# Patient Record
Sex: Female | Born: 2000 | Race: Black or African American | Hispanic: No | Marital: Single | Smoking: Never smoker
Health system: Southern US, Community
[De-identification: ages and names within clinical notes are randomized; demographics above are authoritative.]

## PROBLEM LIST (undated history)

## (undated) HISTORY — PX: OTHER SURGICAL HISTORY: SHX169

---
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
## 2022-11-04 ENCOUNTER — Encounter: Payer: Self-pay | Admitting: Emergency Medicine

## 2022-11-04 ENCOUNTER — Ambulatory Visit
Admission: EM | Admit: 2022-11-04 | Discharge: 2022-11-04 | Disposition: A | Discharge status: Home / Self Care | Payer: Self-pay | Attending: Family Medicine | Admitting: Family Medicine

## 2022-11-04 DIAGNOSIS — N3001 Acute cystitis with hematuria: Secondary | ICD-10-CM | POA: Insufficient documentation

## 2022-11-04 LAB — URINALYSIS, ROUTINE W REFLEX MICROSCOPIC
Glucose, UA: NEGATIVE mg/dL
Leukocytes,Ua: NEGATIVE
Nitrite: POSITIVE — AB
Protein, ur: NEGATIVE mg/dL
Specific Gravity, Urine: >1.03 — ABNORMAL HIGH (ref 1.005–1.030)
pH: 5.5 (ref 5.0–8.0)

## 2022-11-04 LAB — URINALYSIS, MICROSCOPIC (REFLEX)

## 2022-11-04 MED ORDER — FLUCONAZOLE 150 MG PO TABS: 150.0000 mg | ORAL_TABLET | ORAL | 0 refills | Status: AC

## 2022-11-04 MED ORDER — CEPHALEXIN 500 MG PO CAPS: 500.0000 mg | ORAL_CAPSULE | Freq: Four times a day (QID) | ORAL | 0 refills | Status: AC

## 2022-11-04 NOTE — ED Provider Notes (Signed)
[facility]    CSN: [id number] Arrival date & time: 11/04/22  1146      History   Chief Complaint Chief Complaint  Patient presents with   Dysuria     HPI HPI Stephanie Bradshaw is a 21 y.o. female.    Stephanie Bradshaw presents for dysuria that started 2 days ago.  Notes that her urine has a "sour" odor.  Denies known STI exposure.  She is not concerned for STIs.  She uses a Nexplanon for contraception.  Denies nausea, vomiting, vaginal itching, vaginal bleeding, rash, ulcers and fever.  Has normal vaginal discharge.  She is not currently pregnant.  No treatments prior to arrival.    History reviewed. No pertinent past medical history.  There are no problems to display for this patient.   Past Surgical History:  Procedure Laterality Date   lymph node surgery      OB History   No obstetric history on file.      Home Medications    Prior to Admission medications   Medication Sig Start Date End Date Taking? Authorizing Provider  cephALEXin (KEFLEX) 500 MG capsule Take 1 capsule (500 mg total) by mouth 4 (four) times daily. 11/04/22  Yes Robey Massmann, DO  fluconazole (DIFLUCAN) 150 MG tablet Take 1 tablet (150 mg total) by mouth every 3 (three) days for 2 doses. 11/04/22 11/08/22 Yes Katha Cabal, DO    Family History Family History  Problem Relation Age of Onset   Healthy Mother    Healthy Father     Social History Social History   Tobacco Use   Smoking status: Never   Smokeless tobacco: Never  Vaping Use   Vaping Use: Never used  Substance Use Topics   Alcohol use: No   Drug use: No     Allergies   Patient has no known allergies.   Review of Systems Review of Systems: :negative unless otherwise stated in HPI.      Physical Exam Triage Vital Signs ED Triage Vitals  Enc Vitals Group     BP 11/04/22 1337 113/82     Pulse Rate 11/04/22 1337 81     Resp 11/04/22 1337 16     Temp 11/04/22 1337 98.3 F (36.8 C)     Temp  Source 11/04/22 1337 Oral     SpO2 11/04/22 1337 100 %     Weight 11/04/22 1335 126 lb 12.2 oz (57.5 kg)     Height 11/04/22 1335 5' (1.524 m)     Head Circumference --      Peak Flow --      Pain Score 11/04/22 1335 0     Pain Loc --      Pain Edu? --      Excl. in GC? --    No data found.  Updated Vital Signs BP 113/82 (BP Location: Right Arm)   Pulse 81   Temp 98.3 F (36.8 C) (Oral)   Resp 16   Ht 5' (1.524 m)   Wt 57.5 kg   SpO2 100%   BMI 24.76 kg/m   Visual Acuity Right Eye Distance:   Left Eye Distance:   Bilateral Distance:    Right Eye Near:   Left Eye Near:    Bilateral Near:     Physical Exam GEN: well appearing female in no acute distress  CVS: well perfused  RESP: speaking in full sentences without pause  ABD: soft, non-tender, non-distended, no palpable masses, no CVA tenderness  SKIN: warm, dry    UC Treatments / Results  Labs (all labs ordered are listed, but only abnormal results are displayed) Labs Reviewed  URINALYSIS, ROUTINE W REFLEX MICROSCOPIC - Abnormal; Notable for the following components:      Result Value   APPearance HAZY (*)    Specific Gravity, Urine >1.030 (*)    Hgb urine dipstick TRACE (*)    Bilirubin Urine SMALL (*)    Ketones, ur TRACE (*)    Nitrite POSITIVE (*)    All other components within normal limits  URINALYSIS, MICROSCOPIC (REFLEX) - Abnormal; Notable for the following components:   Bacteria, UA MANY (*)    All other components within normal limits  URINE CULTURE    EKG   Radiology No results found.  Procedures Procedures (including critical care time)  Medications Ordered in UC Medications - No data to display  Initial Impression / Assessment and Plan / UC Course  I have reviewed the triage vital signs and the nursing notes.  Pertinent labs & imaging results that were available during my care of the patient were reviewed by me and considered in my medical decision making (see chart for  details).      Patient is a 21 y.o.Marland Kitchen female  who presents for 2 days of dysuria with odor.  Overall patient is well-appearing and afebrile.  Vital signs stable.  UA consistent with acute cystitis.   Hematuria supported on microscopy.  Treat with Keflex 4 times daily for 5 days.  Budding yeast were seen on microscopy. Urine culture obtained.  Follow-up sensitivities and change antibiotics, if needed.  - Treatment: Keflex as above - Diflucan for 2 doses for antibiotic associated yeast infection   Return precautions including abdominal pain, fever, chills, nausea, or vomiting given. Discussed MDM, treatment plan and plan for follow-up with patient who agrees with plan.         Final Clinical Impressions(s) / UC Diagnoses   Final diagnoses:  Acute cystitis with hematuria     Discharge Instructions      You had evidence of of a bacterial infection in your urine today.  Stop by the pharmacy to pick up your prescriptions.  For your UTI: Take Keflex 4 times a day for the next 5 days.  To prevent a yeast infection: Take 1 dose of Diflucan (fluconazole) on the 3rd day of treatment and the last dose the day after you complete your antibiotics.   If your symptoms do not improve in the next 7 days, be sure to follow-up here or at your primary care provider office.  Go to the emergency department if you are having increasing pain, worsening vaginal bleeding or fever.      ED Prescriptions     Medication Sig Dispense Auth. Provider   cephALEXin (KEFLEX) 500 MG capsule Take 1 capsule (500 mg total) by mouth 4 (four) times daily. 20 capsule Falicity Sheets, DO   fluconazole (DIFLUCAN) 150 MG tablet Take 1 tablet (150 mg total) by mouth every 3 (three) days for 2 doses. 2 tablet Katha Cabal, DO      PDMP not reviewed this encounter.   Katha Cabal, DO 11/04/22 1423

## 2022-11-04 NOTE — Discharge Instructions (Addendum)
You had evidence of of a bacterial infection in your urine today.  Stop by the pharmacy to pick up your prescriptions.  For your UTI: Take Keflex 4 times a day for the next 5 days.  To prevent a yeast infection: Take 1 dose of Diflucan (fluconazole) on the 3rd day of treatment and the last dose the day after you complete your antibiotics.   If your symptoms do not improve in the next 7 days, be sure to follow-up here or at your primary care provider office.  Go to the emergency department if you are having increasing pain, worsening vaginal bleeding or fever.

## 2022-11-04 NOTE — ED Triage Notes (Signed)
Pt c/o dysuria, odorous urine. Started about 2 days ago. Denies vaginal discharge, lower back pain, fever, or pelvic pain.

## 2022-11-06 LAB — URINE CULTURE

## 2022-11-07 LAB — URINE CULTURE: Culture: 70000 — AB
